# Patient Record
Sex: Female | Born: 1980 | Marital: Married | Smoking: Never smoker
Health system: Southern US, Community
[De-identification: ages and names within clinical notes are randomized; demographics above are authoritative.]

---

## 2012-10-19 ENCOUNTER — Ambulatory Visit: Payer: Self-pay | Admitting: Family Medicine

## 2013-09-13 ENCOUNTER — Emergency Department: Payer: Self-pay | Admitting: Emergency Medicine

## 2014-08-02 ENCOUNTER — Ambulatory Visit
Admission: RE | Admit: 2014-08-02 | Discharge: 2014-08-02 | Disposition: A | Discharge status: Home / Self Care | Payer: 59 | Source: Ambulatory Visit | Attending: Family Medicine | Admitting: Family Medicine

## 2014-08-02 ENCOUNTER — Other Ambulatory Visit: Payer: Self-pay | Admitting: Family Medicine

## 2014-08-02 DIAGNOSIS — R05 Cough: Secondary | ICD-10-CM | POA: Diagnosis present

## 2014-08-02 DIAGNOSIS — R059 Cough, unspecified: Secondary | ICD-10-CM

## 2014-12-22 ENCOUNTER — Ambulatory Visit
Admission: EM | Admit: 2014-12-22 | Discharge: 2014-12-22 | Disposition: A | Discharge status: Home / Self Care | Payer: 59 | Attending: Internal Medicine | Admitting: Internal Medicine

## 2014-12-22 DIAGNOSIS — T700XXA Otitic barotrauma, initial encounter: Secondary | ICD-10-CM

## 2014-12-22 DIAGNOSIS — J302 Other seasonal allergic rhinitis: Secondary | ICD-10-CM

## 2014-12-22 LAB — RAPID STREP SCREEN (MED CTR MEBANE ONLY): STREPTOCOCCUS, GROUP A SCREEN (DIRECT): NEGATIVE

## 2014-12-22 MED ORDER — HYDROCOD POLST-CPM POLST ER 10-8 MG/5ML PO SUER: 5.0000 mL | Freq: Two times a day (BID) | ORAL | Status: DC | PRN

## 2014-12-22 MED ORDER — BENZONATATE 100 MG PO CAPS: 100.0000 mg | ORAL_CAPSULE | Freq: Three times a day (TID) | ORAL | Status: DC | PRN

## 2014-12-22 MED ORDER — FLUTICASONE PROPIONATE 50 MCG/ACT NA SUSP: 2.0000 | Freq: Every day | NASAL | Status: AC

## 2014-12-22 NOTE — Discharge Instructions (Signed)
Robitussin DM  ( Guaifenesin DM )  1 teaspoon every 4 hours   Increase your liquids so that your lips are not dry and your void about every 2 hours  Claritin ( loratadine )  1 tablet a day ---if the post nasal drip goes away-continue this until about Christmas      Barotitis Media Barotitis media is inflammation of your middle ear. This occurs when the auditory tube (eustachian tube) leading from the back of your nose (nasopharynx) to your eardrum is blocked. This blockage may result from a cold, environmental allergies, or an upper respiratory infection. Unresolved barotitis media may lead to damage or hearing loss (barotrauma), which may become permanent. HOME CARE INSTRUCTIONS   Use medicines as recommended by your health care provider. Over-the-counter medicines will help unblock the canal and can help during times of air travel.  Do not put anything into your ears to clean or unplug them. Eardrops will not be helpful.  Do not swim, dive, or fly until your health care provider says it is all right to do so. If these activities are necessary, chewing gum with frequent, forceful swallowing may help. It is also helpful to hold your nose and gently blow to pop your ears for equalizing pressure changes. This forces air into the eustachian tube.  Only take over-the-counter or prescription medicines for pain, discomfort, or fever as directed by your health care provider.  A decongestant may be helpful in decongesting the middle ear and make pressure equalization easier. SEEK MEDICAL CARE IF:  You experience a serious form of dizziness in which you feel as if the room is spinning and you feel nauseated (vertigo).  Your symptoms only involve one ear. SEEK IMMEDIATE MEDICAL CARE IF:   You develop a severe headache, dizziness, or severe ear pain.  You have bloody or pus-like drainage from your ears.  You develop a fever.  Your problems do not improve or become worse. MAKE SURE YOU:    Understand these instructions.  Will watch your condition.  Will get help right away if you are not doing well or get worse. Document Released: 03/06/2000 Document Revised: 12/28/2012 Document Reviewed: 10/04/2012 [facility] Patient Information 2015 [place], [place]. This information is not intended to replace advice given to you by your health care provider. Make sure you discuss any questions you have with your health care provider.

## 2014-12-22 NOTE — ED Notes (Signed)
Patient sick for the last 6 days with cold, congestion and cough.

## 2014-12-24 ENCOUNTER — Encounter: Payer: Self-pay | Admitting: Physician Assistant

## 2014-12-24 LAB — CULTURE, GROUP A STREP (THRC)

## 2014-12-24 NOTE — ED Provider Notes (Signed)
CSN: [id number]     Arrival date & time 12/22/14  1506 History   First MD Initiated Contact with Patient 12/22/14 1704     Chief Complaint  Patient presents with  . Nasal Congestion  . Cough   (Consider location/radiation/quality/duration/timing/severity/associated sxs/prior Treatment) Patient is a 34 y.o. female presenting with cough. The history is provided by the patient.  Cough Cough characteristics:  Non-productive and dry Severity:  Mild Onset quality:  Gradual Duration:  6 days Timing:  Intermittent Progression:  Waxing and waning Chronicity:  New Smoker: no   Context: sick contacts   Worsened by:  Nothing tried Ineffective treatments:  Fluids Associated symptoms: rhinorrhea and sore throat   She has had post nasal drip and congestion with a runny nose and sore throat. No particular fever, mild malaise. Not sleeping well -coughing- Ears feel full and crackle  History reviewed. No pertinent past medical history. History reviewed. No pertinent past surgical history. History reviewed. No pertinent family history. Social History  Substance Use Topics  . Smoking status: Never Smoker   . Smokeless tobacco: Never Used  . Alcohol Use: No   OB History    No data available     Review of Systems  Constitutional: Positive for appetite change and fatigue.  HENT: Positive for congestion, rhinorrhea and sore throat.   Eyes: Negative.   Respiratory: Positive for cough.   Cardiovascular: Negative.   Gastrointestinal: Negative.   Endocrine: Negative.   Genitourinary: Negative.   Musculoskeletal: Negative.   Skin: Negative.   Allergic/Immunologic: Negative.   Neurological: Negative.   Hematological: Negative.   Psychiatric/Behavioral: Negative.     Allergies  Review of patient's allergies indicates no known allergies.  Home Medications   Prior to Admission medications   Medication Sig Start Date End Date Taking? Authorizing Provider  benzonatate (TESSALON) 100 MG  capsule Take 1 capsule (100 mg total) by mouth 3 (three) times daily as needed. 12/22/14   Rae Halsted, PA-C  chlorpheniramine-HYDROcodone [facility] PENNKINETIC ER) 10-8 MG/5ML SUER Take 5 mLs by mouth every 12 (twelve) hours as needed for cough. 12/22/14   Rae Halsted, PA-C  fluticasone [facility]) 50 MCG/ACT nasal spray Place 2 sprays into both nostrils daily. 12/22/14   Rae Halsted, PA-C   Meds Ordered and Administered this Visit  Medications - No data to display  BP 116/67 mmHg  Pulse 66  Temp(Src) 99 F (37.2 C) (Tympanic)  Ht  (1.651 m)  Wt 183 lb (83.008 kg)  BMI 30.45 kg/m2  SpO2 100%  LMP 11/25/2014 (Approximate) No data found.   Physical Exam  Constitutional: She is oriented to person, place, and time. She appears well-developed and well-nourished.  HENT:  Head: Normocephalic and atraumatic.  Right Ear: Hearing normal.  Left Ear: Hearing normal.  Nose: Rhinorrhea present. Right sinus exhibits no maxillary sinus tenderness. Left sinus exhibits no maxillary sinus tenderness.  Mouth/Throat: Posterior oropharyngeal erythema present. No oropharyngeal exudate.  Eyes: Right eye exhibits no discharge. Left eye exhibits no discharge.  Neck: Normal range of motion.  Cardiovascular: Normal rate and regular rhythm.   Pulmonary/Chest: Effort normal and breath sounds normal. No respiratory distress.  Musculoskeletal: Normal range of motion.  Lymphadenopathy:    She has no cervical adenopathy.  Neurological: She is alert and oriented to person, place, and time.  Skin: Skin is warm and dry. No rash noted.  Psychiatric: She has a normal mood and affect.  Eustachian tube dysfunction Ears -canals negative, TM mildy retracted,  cloudy  ED Course  Procedures (including critical care time)  Labs Review Labs Reviewed  RAPID STREP SCREEN (NOT AT [facility])  CULTURE, GROUP A STREP ([facility] ONLY)   Results for orders placed or performed during the hospital encounter of 12/22/14  Rapid  strep screen  Result Value Ref Range   Streptococcus, Group A Screen (Direct) NEGATIVE NEGATIVE  Culture, group A strep ([facility] only)  Result Value Ref Range   Specimen Description THROAT    Special Requests NONE    Culture NO BETA HEMOLYTIC STREPTOCOCCI ISOLATED    Report Status 12/24/2014 FINAL    Imaging Review No results found.         MDM   1. Barotitis media, initial encounter   Diagnosis and treatment discussed.  Questions fielded, expectations and recommendations reviewed.  Patient expresses understanding. Will return to [facility] with questions, concern or exacerbation.     Discharge Medication List as of 12/22/2014  6:11 PM    START taking these medications   Details  benzonatate (TESSALON) 100 MG capsule Take 1 capsule (100 mg total) by mouth 3 (three) times daily as needed., Starting 12/22/2014, Until Discontinued, Print    chlorpheniramine-HYDROcodone (TUSSIONEX PENNKINETIC ER) 10-8 MG/5ML SUER Take 5 mLs by mouth every 12 (twelve) hours as needed for cough., Starting 12/22/2014, Until Discontinued, Print    fluticasone (FLONASE) 50 MCG/ACT nasal spray Place 2 sprays into both nostrils daily., Starting 12/22/2014, Until Discontinued, Print         Rae Halsted, PA-C 12/24/14 2300

## 2015-01-02 ENCOUNTER — Ambulatory Visit
Admission: RE | Admit: 2015-01-02 | Discharge: 2015-01-02 | Disposition: A | Discharge status: Home / Self Care | Payer: 59 | Source: Ambulatory Visit | Attending: Family Medicine | Admitting: Family Medicine

## 2015-01-02 ENCOUNTER — Encounter: Payer: Self-pay | Admitting: Family Medicine

## 2015-01-02 ENCOUNTER — Ambulatory Visit (INDEPENDENT_AMBULATORY_CARE_PROVIDER_SITE_OTHER): Payer: 59 | Admitting: Family Medicine

## 2015-01-02 VITALS — BP 118/68 | HR 97 | Temp 100.4°F | Resp 19 | Ht 65.0 in | Wt 180.3 lb

## 2015-01-02 DIAGNOSIS — J189 Pneumonia, unspecified organism: Secondary | ICD-10-CM | POA: Insufficient documentation

## 2015-01-02 DIAGNOSIS — R509 Fever, unspecified: Secondary | ICD-10-CM | POA: Diagnosis present

## 2015-01-02 DIAGNOSIS — J0100 Acute maxillary sinusitis, unspecified: Secondary | ICD-10-CM

## 2015-01-02 DIAGNOSIS — R058 Other specified cough: Secondary | ICD-10-CM

## 2015-01-02 DIAGNOSIS — R05 Cough: Secondary | ICD-10-CM

## 2015-01-02 DIAGNOSIS — R0982 Postnasal drip: Secondary | ICD-10-CM | POA: Insufficient documentation

## 2015-01-02 DIAGNOSIS — L989 Disorder of the skin and subcutaneous tissue, unspecified: Secondary | ICD-10-CM | POA: Insufficient documentation

## 2015-01-02 MED ORDER — AZITHROMYCIN 250 MG PO TABS: ORAL_TABLET | ORAL | Status: DC

## 2015-01-02 MED ORDER — GUAIFENESIN-CODEINE 100-10 MG/5ML PO SOLN: 10.0000 mL | Freq: Four times a day (QID) | ORAL | Status: DC | PRN

## 2015-01-02 NOTE — Progress Notes (Signed)
Name: Waynard EdwardsSruti D Koone   MRN: [id number]    DOB: 1980-07-13   Date:01/02/2015       Progress Note  Subjective  Chief Complaint  Chief Complaint  Patient presents with  . Acute Visit    Cough x10 days   Cough This is a new problem. The current episode started 1 to 4 weeks ago (over 10 days ago.). The problem has been gradually worsening. The cough is productive of sputum. Associated symptoms include chills, ear pain, a fever, headaches and nasal congestion. Pertinent negatives include no hemoptysis, sore throat or shortness of breath. Associated symptoms comments: Pressure over the sinuses.. She has tried prescription cough suppressant for the symptoms. The treatment provided mild relief. There is no history of asthma or COPD.   History reviewed. No pertinent past medical history.  Past Surgical History  Procedure Laterality Date  . Cesarean section  2014    Family History  Problem Relation Age of Onset  . Hypertension Father   . Kidney failure Mother     Social History   Social History  . Marital Status: Married    Spouse Name: N/A  . Number of Children: N/A  . Years of Education: N/A   Occupational History  . Not on file.   Social History Main Topics  . Smoking status: Never Smoker   . Smokeless tobacco: Never Used  . Alcohol Use: No  . Drug Use: Not on file  . Sexual Activity: Not on file   Other Topics Concern  . Not on file   Social History Narrative    Current outpatient prescriptions:  .  benzonatate (TESSALON) 100 MG capsule, Take 1 capsule (100 mg total) by mouth 3 (three) times daily as needed., Disp: 30 capsule, Rfl: 0 .  fluticasone (FLONASE) 50 MCG/ACT nasal spray, Place 2 sprays into both nostrils daily., Disp: 16 g, Rfl: 2 .  chlorpheniramine-HYDROcodone (TUSSIONEX PENNKINETIC ER) 10-8 MG/5ML SUER, Take 5 mLs by mouth every 12 (twelve) hours as needed for cough. (Patient not taking: Reported on 01/02/2015), Disp: 60 mL, Rfl: 0  No Known  Allergies  Review of Systems  Constitutional: Positive for fever and chills.  HENT: Positive for ear pain. Negative for sore throat.   Respiratory: Positive for cough. Negative for hemoptysis and shortness of breath.   Neurological: Positive for headaches.   Objective  Filed Vitals:   01/02/15 0944  BP: 118/68  Pulse: 97  Temp: 100.4 F (38 C)  TempSrc: Oral  Resp: 19  Height: 5\' 5"  (1.651 m)  Weight: 180 lb 4.8 oz (81.784 kg)  SpO2: 96%   Physical Exam  Constitutional: She is well-developed, well-nourished, and in no distress.  HENT:  Head: Normocephalic and atraumatic.  Right Ear: Tympanic membrane normal.  Left Ear: Tympanic membrane and ear canal normal.  Nose: Right sinus exhibits maxillary sinus tenderness. Left sinus exhibits maxillary sinus tenderness.  Mouth/Throat: Posterior oropharyngeal erythema present.  Mild erythema in the upper right ear canal.  Cardiovascular: Regular rhythm.   Pulmonary/Chest: Breath sounds normal.  Soft expiratory wheezes in right and left lung fields.  Nursing note and vitals reviewed.  Assessment & Plan  1. Acute non-recurrent maxillary sinusitis Symptoms consistent with acute maxillary sinusitis. Started on antibiotic therapy. - azithromycin (ZITHROMAX Z-PAK) 250 MG tablet; 2 tabs po x day 1, then 1 tab po q day x 4 days  Dispense: 6 each; Refill: 0  2. Productive cough Obtain chest x-ray to rule out pneumonia given the duration of  symptoms and abnormal lung exam. Started on codeine for relief of coughing. - DG Chest 2 View; Future - guaiFENesin-codeine 100-10 MG/5ML syrup; Take 10 mLs by mouth every 6 (six) hours as needed for cough.  Dispense: 200 mL; Refill: 0   Bohdan Macho Asad A. [facility] 01/02/2015 9:52 AM

## 2015-01-03 ENCOUNTER — Telehealth: Payer: Self-pay | Admitting: Family Medicine

## 2015-01-03 NOTE — Telephone Encounter (Signed)
Patient saw her xray in MyChart and it said she had pneumonia. She would like for you to check the results and would like something called in [facility] [address]

## 2015-01-03 NOTE — Telephone Encounter (Signed)
Results have been reported to patient 

## 2015-01-08 ENCOUNTER — Telehealth: Payer: Self-pay | Admitting: Family Medicine

## 2015-01-08 DIAGNOSIS — R05 Cough: Secondary | ICD-10-CM

## 2015-01-08 DIAGNOSIS — J189 Pneumonia, unspecified organism: Secondary | ICD-10-CM

## 2015-01-08 DIAGNOSIS — R058 Other specified cough: Secondary | ICD-10-CM

## 2015-01-08 MED ORDER — GUAIFENESIN-CODEINE 100-10 MG/5ML PO SOLN: 10.0000 mL | Freq: Four times a day (QID) | ORAL | Status: DC | PRN

## 2015-01-08 NOTE — Telephone Encounter (Signed)
PT SAID THAT SHE IS STILL HAVING  THE COUGH AND HAS TAKEN UP THE MEDICATION THAT YOU GAVE HER. SHE WANTS TO KNOW IF YOU COULD GIVE HER MORE COUGH MEDICINE AND SHE IS ALSO WANTING TO KNOW IF YOU  WANT HER TO GET MORE ANTIBIOTICS. SHE IS HAVING STILL WHEEZING AND [place]AV[place] ON [place] 9 AND BE GONE FOR 3 MONTHS. ALSO WANTS TO KNOW IF SHE CAN HAVE HER XRAY AROUND NOV 3RD OR 4TH AND HAVE AN APPT TO GET RESULTS BEFORE SHE LEAVES ON NOV 9TH.

## 2015-01-08 NOTE — Telephone Encounter (Signed)
Patient has wheezing, some shortness of breath on exertion but her coughing has significantly decreased. No fevers or chills. She has finished a course of antibiotics. We have recommended that she use albuterol inhaler up to 4 times a day for wheezing and will prescribe another week off Cheratussin. Patient will return in 10 days to repeat chest x-ray.

## 2015-01-08 NOTE — Telephone Encounter (Signed)
Ordered chest x-ray for 01/17/2015

## 2015-01-08 NOTE — Telephone Encounter (Signed)
NEEDS XRAY ORDERS FOR CHEST XRAY

## 2015-01-09 ENCOUNTER — Telehealth: Payer: Self-pay | Admitting: Family Medicine

## 2015-01-09 ENCOUNTER — Other Ambulatory Visit: Payer: Self-pay | Admitting: Family Medicine

## 2015-01-09 DIAGNOSIS — R05 Cough: Secondary | ICD-10-CM

## 2015-01-09 DIAGNOSIS — R058 Other specified cough: Secondary | ICD-10-CM

## 2015-01-09 MED ORDER — GUAIFENESIN-CODEINE 100-10 MG/5ML PO SOLN: 10.0000 mL | Freq: Four times a day (QID) | ORAL | Status: DC | PRN

## 2015-01-09 NOTE — Telephone Encounter (Signed)
Spoke with patient and notified her that a message was left asking her to pick up prescription at office and bring photo ID, she will come in and pick up prescription in the morning 01/10/2015

## 2015-01-09 NOTE — Telephone Encounter (Signed)
Pt made aware and said that the dr also called and spoke with her.

## 2015-01-09 NOTE — Telephone Encounter (Signed)
Pt made aware of orders.

## 2015-01-09 NOTE — Telephone Encounter (Signed)
Patient spoke with Dr Sherryll BurgerShah on yesterday and was told that he was going to call in a cough syrup for her. She went to the pharmacy to pick it up and it was not there. Please advise

## 2015-01-17 ENCOUNTER — Ambulatory Visit (INDEPENDENT_AMBULATORY_CARE_PROVIDER_SITE_OTHER): Payer: 59

## 2015-01-17 ENCOUNTER — Ambulatory Visit
Admission: RE | Admit: 2015-01-17 | Discharge: 2015-01-17 | Disposition: A | Discharge status: Home / Self Care | Payer: 59 | Source: Ambulatory Visit | Attending: Family Medicine | Admitting: Family Medicine

## 2015-01-17 DIAGNOSIS — Z23 Encounter for immunization: Secondary | ICD-10-CM

## 2015-01-17 DIAGNOSIS — J189 Pneumonia, unspecified organism: Secondary | ICD-10-CM | POA: Insufficient documentation

## 2015-01-28 ENCOUNTER — Ambulatory Visit: Payer: 59 | Admitting: Family Medicine

## 2015-07-17 ENCOUNTER — Encounter: Payer: Self-pay | Admitting: Family Medicine

## 2015-07-17 ENCOUNTER — Ambulatory Visit (INDEPENDENT_AMBULATORY_CARE_PROVIDER_SITE_OTHER): Payer: 59 | Admitting: Family Medicine

## 2015-07-17 VITALS — BP 100/66 | HR 81 | Temp 99.3°F | Resp 18 | Ht 64.0 in | Wt 189.4 lb

## 2015-07-17 DIAGNOSIS — L700 Acne vulgaris: Secondary | ICD-10-CM | POA: Diagnosis not present

## 2015-07-17 MED ORDER — CLINDAMYCIN PHOS-BENZOYL PEROX 1-5 % EX GEL: Freq: Two times a day (BID) | CUTANEOUS | Status: DC

## 2015-07-17 NOTE — Progress Notes (Signed)
Name: Katherine EdwardsSruti D Mcintyre   MRN: [id number]    DOB: [birth year]   Date:07/17/2015       Progress Note  Subjective  Chief Complaint  Chief Complaint  Patient presents with  . Acne    Rash This is a recurrent problem. The current episode started more than 1 month ago (2-3 months ago.). The affected locations include the face. The rash is characterized by dryness and redness (starts off as a red bump and then dries off, no drainage.). She was exposed to nothing. Pertinent negatives include no fever. Past treatments include nothing.  Patient has history of acne, facial lesions coincide with her menstrual cycle. Has not changed any soap, shampoo, clothing, bedding, or other items that may come into contact with her skin  History reviewed. No pertinent past medical history.  Past Surgical History  Procedure Laterality Date  . Cesarean section  2014    Family History  Problem Relation Age of Onset  . Hypertension Father   . Kidney failure Mother     Social History   Social History  . Marital Status: Married    Spouse Name: N/A  . Number of Children: N/A  . Years of Education: N/A   Occupational History  . Not on file.   Social History Main Topics  . Smoking status: Never Smoker   . Smokeless tobacco: Never Used  . Alcohol Use: No  . Drug Use: Not on file  . Sexual Activity: Not on file   Other Topics Concern  . Not on file   Social History Narrative     Current outpatient prescriptions:  .  fluticasone (FLONASE) 50 MCG/ACT nasal spray, Place 2 sprays into both nostrils daily., Disp: 16 g, Rfl: 2 .  azithromycin (ZITHROMAX Z-PAK) 250 MG tablet, 2 tabs po x day 1, then 1 tab po q day x 4 days (Patient not taking: Reported on 07/17/2015), Disp: 6 each, Rfl: 0 .  guaiFENesin-codeine 100-10 MG/5ML syrup, Take 10 mLs by mouth every 6 (six) hours as needed for cough. (Patient not taking: Reported on 07/17/2015), Disp: 300 mL, Rfl: 0  No Known Allergies   Review of Systems   Constitutional: Negative for fever, chills and weight loss.  Cardiovascular: Negative for chest pain.  Gastrointestinal: Negative for abdominal pain.  Skin: Positive for rash. Negative for itching.     Objective  Filed Vitals:   07/17/15 1123  BP: 100/66  Pulse: 81  Temp: 99.3 F (37.4 C)  TempSrc: Oral  Resp: 18  Height: 5\' 4"  (1.626 m)  Weight: 189 lb 6.4 oz (85.911 kg)  SpO2: 98%    Physical Exam  Constitutional: She is oriented to person, place, and time and well-developed, well-nourished, and in no distress.  Neurological: She is alert and oriented to person, place, and time.  Skin: Skin is warm. Rash noted. Rash is pustular.  Pustular lesions on the face, some with surrounding macular rash, no drainage visible, lesions are pruritic in nature.   Nursing note and vitals reviewed.      Assessment & Plan  1. Superficial inflammatory acne vulgaris Exam and history consistent with acne, will start on BenzaClin gel to be applied twice daily. Recheck in one month. - clindamycin-benzoyl peroxide (BENZACLIN) gel; Apply topically 2 (two) times daily.  Dispense: 50 g; Refill: 0   Katherine Mcintyre Katherine A. Faylene KurtzShah [facility] 07/17/2015 11:34 AM

## 2015-08-23 ENCOUNTER — Other Ambulatory Visit: Payer: Self-pay | Admitting: Family Medicine

## 2015-08-23 ENCOUNTER — Ambulatory Visit (INDEPENDENT_AMBULATORY_CARE_PROVIDER_SITE_OTHER): Payer: 59 | Admitting: Family Medicine

## 2015-08-23 ENCOUNTER — Encounter: Payer: Self-pay | Admitting: Family Medicine

## 2015-08-23 VITALS — BP 103/68 | HR 75 | Temp 98.9°F | Resp 15 | Ht 64.0 in | Wt 191.6 lb

## 2015-08-23 DIAGNOSIS — Z01419 Encounter for gynecological examination (general) (routine) without abnormal findings: Secondary | ICD-10-CM

## 2015-08-23 NOTE — Progress Notes (Signed)
Name: Katherine Mcintyre   MRN: [id number]    DOB: 31-May-1980   Date:08/23/2015       Progress Note  Subjective  Chief Complaint  Chief Complaint  Patient presents with  . Annual Exam    CPE w/pap    HPI  Pt. Is here for a Complete Physical Exam,  No Gynecologist, last seen for pregnancy in April 2014 and did not follow up after delivery.   History reviewed. No pertinent past medical history.  Past Surgical History  Procedure Laterality Date  . Cesarean section  2014    Family History  Problem Relation Age of Onset  . Hypertension Father   . Kidney failure Mother     Social History   Social History  . Marital Status: Married    Spouse Name: N/A  . Number of Children: N/A  . Years of Education: N/A   Occupational History  . Not on file.   Social History Main Topics  . Smoking status: Never Smoker   . Smokeless tobacco: Never Used  . Alcohol Use: No  . Drug Use: Not on file  . Sexual Activity: Not on file   Other Topics Concern  . Not on file   Social History Narrative     Current outpatient prescriptions:  .  fluticasone (FLONASE) 50 MCG/ACT nasal spray, Place 2 sprays into both nostrils daily., Disp: 16 g, Rfl: 2  No Known Allergies   Review of Systems  Constitutional: Negative for fever and chills.  HENT: Positive for congestion.   Eyes: Negative for blurred vision.  Respiratory: Negative for cough and shortness of breath.   Cardiovascular: Negative for chest pain.  Gastrointestinal: Negative for nausea, vomiting, abdominal pain, diarrhea and constipation.  Genitourinary: Negative for dysuria and frequency.  Musculoskeletal: Positive for back pain (chronic low back pain.). Negative for myalgias.  Skin: Negative for rash.  Neurological: Negative for headaches.  Psychiatric/Behavioral: Negative for depression. The patient is not nervous/anxious and does not have insomnia.     Objective  Filed Vitals:   08/23/15 0909  BP: 103/68  Pulse: 75    Temp: 98.9 F (37.2 C)  TempSrc: Oral  Resp: 15  Height:  (1.626 m)  Weight: 191 lb 9.6 oz (86.909 kg)  SpO2: 97%    Physical Exam  Constitutional: She is oriented to person, place, and time and well-developed, well-nourished, and in no distress.  HENT:  Head: Normocephalic and atraumatic.  Right Ear: Tympanic membrane and ear canal normal.  Left Ear: Tympanic membrane and ear canal normal.  Nose: Right sinus exhibits no maxillary sinus tenderness. Left sinus exhibits no maxillary sinus tenderness.  Mouth/Throat: No posterior oropharyngeal erythema.  Right nasal turbinate hypertrophy.  Eyes: Pupils are equal, round, and reactive to light.  Cardiovascular: S1 normal and S2 normal.   Murmur heard.  Systolic murmur is present with a grade of 2/6  Grade 2/6 systolic murmur   Pulmonary/Chest: Breath sounds normal.  Abdominal: Soft. Bowel sounds are normal. There is no tenderness. There is no CVA tenderness.  Genitourinary: Vagina normal, uterus normal, cervix normal, right adnexa normal and left adnexa normal. Cervix exhibits no motion tenderness.  Musculoskeletal:       Right ankle: She exhibits no swelling.       Left ankle: She exhibits no swelling.       Lumbar back: She exhibits tenderness and spasm.       Back:  Neurological: She is alert and oriented to person, place, and  time.  Skin: Skin is warm and dry.  Psychiatric: Mood, memory, affect and judgment normal.  Nursing note and vitals reviewed.     Assessment & Plan  1. Well woman exam with routine gynecological exam Obtain Pap smear and other age appropriate screenings. - Cytology - PAP - Lipid Profile - Comprehensive Metabolic Panel (CMET) - TSH - Vitamin D (25 hydroxy)   Naba Sneed Asad A. Faylene KurtzShah [facility] 08/23/2015 9:36 AM

## 2015-08-24 LAB — COMPREHENSIVE METABOLIC PANEL
A/G RATIO: 1.5 (ref 1.2–2.2)
ALBUMIN: 4 g/dL (ref 3.5–5.5)
ALK PHOS: 49 IU/L (ref 39–117)
ALT: 13 IU/L (ref 0–32)
AST: 15 IU/L (ref 0–40)
BILIRUBIN TOTAL: 0.3 mg/dL (ref 0.0–1.2)
BUN / CREAT RATIO: 17 (ref 9–23)
BUN: 8 mg/dL (ref 6–20)
CALCIUM: 9 mg/dL (ref 8.7–10.2)
CHLORIDE: 101 mmol/L (ref 96–106)
CO2: 25 mmol/L (ref 18–29)
Creatinine, Ser: 0.47 mg/dL — ABNORMAL LOW (ref 0.57–1.00)
GFR calc Af Amer: 149 mL/min/{1.73_m2} (ref >59)
GFR calc non Af Amer: 129 mL/min/{1.73_m2} (ref >59)
GLOBULIN, TOTAL: 2.6 g/dL (ref 1.5–4.5)
Glucose: 96 mg/dL (ref 65–99)
POTASSIUM: 4.4 mmol/L (ref 3.5–5.2)
SODIUM: 139 mmol/L (ref 134–144)
Total Protein: 6.6 g/dL (ref 6.0–8.5)

## 2015-08-24 LAB — LIPID PANEL
CHOLESTEROL TOTAL: 170 mg/dL (ref 100–199)
Chol/HDL Ratio: 3.9 ratio units (ref 0.0–4.4)
HDL: 44 mg/dL (ref >39)
LDL CALC: 111 mg/dL — AB (ref 0–99)
TRIGLYCERIDES: 73 mg/dL (ref 0–149)
VLDL Cholesterol Cal: 15 mg/dL (ref 5–40)

## 2015-08-24 LAB — TSH: TSH: 2.3 u[IU]/mL (ref 0.450–4.500)

## 2015-08-24 LAB — VITAMIN D 25 HYDROXY (VIT D DEFICIENCY, FRACTURES): Vit D, 25-Hydroxy: 20.6 ng/mL — ABNORMAL LOW (ref 30.0–100.0)

## 2015-08-28 ENCOUNTER — Other Ambulatory Visit: Payer: Self-pay | Admitting: Family Medicine

## 2015-08-28 ENCOUNTER — Encounter: Payer: Self-pay | Admitting: Family Medicine

## 2015-08-28 ENCOUNTER — Ambulatory Visit (INDEPENDENT_AMBULATORY_CARE_PROVIDER_SITE_OTHER): Payer: 59 | Admitting: Family Medicine

## 2015-08-28 VITALS — BP 103/70 | HR 86 | Temp 98.9°F | Resp 15 | Ht 64.0 in | Wt 189.8 lb

## 2015-08-28 DIAGNOSIS — M79641 Pain in right hand: Secondary | ICD-10-CM | POA: Insufficient documentation

## 2015-08-28 DIAGNOSIS — Z124 Encounter for screening for malignant neoplasm of cervix: Secondary | ICD-10-CM | POA: Diagnosis not present

## 2015-08-28 DIAGNOSIS — E559 Vitamin D deficiency, unspecified: Secondary | ICD-10-CM | POA: Diagnosis not present

## 2015-08-28 MED ORDER — VITAMIN D (ERGOCALCIFEROL) 1.25 MG (50000 UNIT) PO CAPS: 50000.0000 [IU] | ORAL_CAPSULE | ORAL | Status: DC

## 2015-08-28 MED ORDER — CELECOXIB 100 MG PO CAPS: 100.0000 mg | ORAL_CAPSULE | Freq: Two times a day (BID) | ORAL | Status: AC

## 2015-08-28 NOTE — Progress Notes (Signed)
Name: Katherine EdwardsSruti D Quadros   MRN: [id number]    DOB: 02-10-1981   Date:08/28/2015       Progress Note  Subjective  Chief Complaint  Chief Complaint  Patient presents with  . Follow-up    repeat pap    Hand Pain  The incident occurred more than 1 week ago. There was no injury mechanism. The pain is present in the right hand (Pain in right hand palmar surface superior to the wrist.). The quality of the pain is described as shooting. The pain radiates to the right arm. She has tried immobilization for the symptoms.   Patient is here to repeat Pap Smear. She was called back for a repeat pap smear because the collection tube was expired and [facility] returned the specimen back.    History reviewed. No pertinent past medical history.  Past Surgical History  Procedure Laterality Date  . Cesarean section  2014    Family History  Problem Relation Age of Onset  . Hypertension Father   . Kidney failure Mother     Social History   Social History  . Marital Status: Married    Spouse Name: N/A  . Number of Children: N/A  . Years of Education: N/A   Occupational History  . Not on file.   Social History Main Topics  . Smoking status: Never Smoker   . Smokeless tobacco: Never Used  . Alcohol Use: No  . Drug Use: Not on file  . Sexual Activity: Not on file   Other Topics Concern  . Not on file   Social History Narrative     Current outpatient prescriptions:  .  clindamycin-benzoyl peroxide (BENZACLIN) gel, , Disp: , Rfl:  .  fluticasone (FLONASE) 50 MCG/ACT nasal spray, Place 2 sprays into both nostrils daily., Disp: 16 g, Rfl: 2  No Known Allergies   Review of Systems  Musculoskeletal: Positive for myalgias and joint pain.    Objective  Filed Vitals:   08/28/15 1003  BP: 103/70  Pulse: 86  Temp: 98.9 F (37.2 C)  TempSrc: Oral  Resp: 15  Height: 5\' 4"  (1.626 m)  Weight: 189 lb 12.8 oz (86.093 kg)  SpO2: 98%    Physical Exam  Constitutional: She is  well-developed, well-nourished, and in no distress.  Genitourinary: Cervix normal. Cervix exhibits no lesion and no tenderness.  Musculoskeletal:       Right hand: She exhibits tenderness. Normal sensation noted.       Hands: Tenderness to palpation along the right hand thenar muscles,   Nursing note and vitals reviewed.    Assessment & Plan  1. Encounter for repeat Papanicolaou smear of cervix Repeated Pap smear, will follow-up with results. - Cytology - PAP  2. 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 Advised to continue with immobilization, start on NSAID therapy. Follow-up in 2 weeks if no clinical improvement  - celecoxib (CELEBREX) 100 MG capsule; Take 1 capsule (100 mg total) by mouth 2 (two) times daily.  Dispense: 20 capsule; Refill: 0  3. Vitamin D deficiency Started on vitamin D 50,000 units for 12 weeks. - Vitamin D, Ergocalciferol, (DRISDOL) 50000 units CAPS capsule; Take 1 capsule (50,000 Units total) by mouth every 7 (seven) days.  Dispense: 12 capsule; Refill: 0   Hersh Minney Asad A. Faylene KurtzShah [facility] 08/28/2015 10:16 AM

## 2015-09-02 LAB — PAP LB (LIQUID-BASED): PAP Smear Comment: 0

## 2015-09-06 ENCOUNTER — Telehealth: Payer: Self-pay

## 2015-09-06 MED ORDER — VITAMIN D (ERGOCALCIFEROL) 1.25 MG (50000 UNIT) PO CAPS: 50000.0000 [IU] | ORAL_CAPSULE | ORAL | Status: AC

## 2015-09-06 NOTE — Telephone Encounter (Signed)
Lab results have been reported to patient and a prescription for Vitamin D 50,000 units take 1 capsule once a week for 12 weeks [facility] Dr. Sherryll BurgerShah, patient has been notified and verbalized understanding

## 2015-09-10 LAB — GYN REPORT: PAP Smear Comment: 0

## 2015-09-10 LAB — SPECIMEN STATUS REPORT

## 2015-11-22 ENCOUNTER — Other Ambulatory Visit: Payer: Self-pay | Admitting: Family Medicine

## 2016-01-23 IMAGING — CR DG CHEST 2V
1 series · 2 of 2 positions shown · non-contrast
Comparison: None.

CLINICAL DATA: Productive cough.

EXAM:
CHEST  2 VIEW

[Series 1: dg chest 2 view · 0.14mm/px · 2 of 2 slices shown]
[im 1/2]
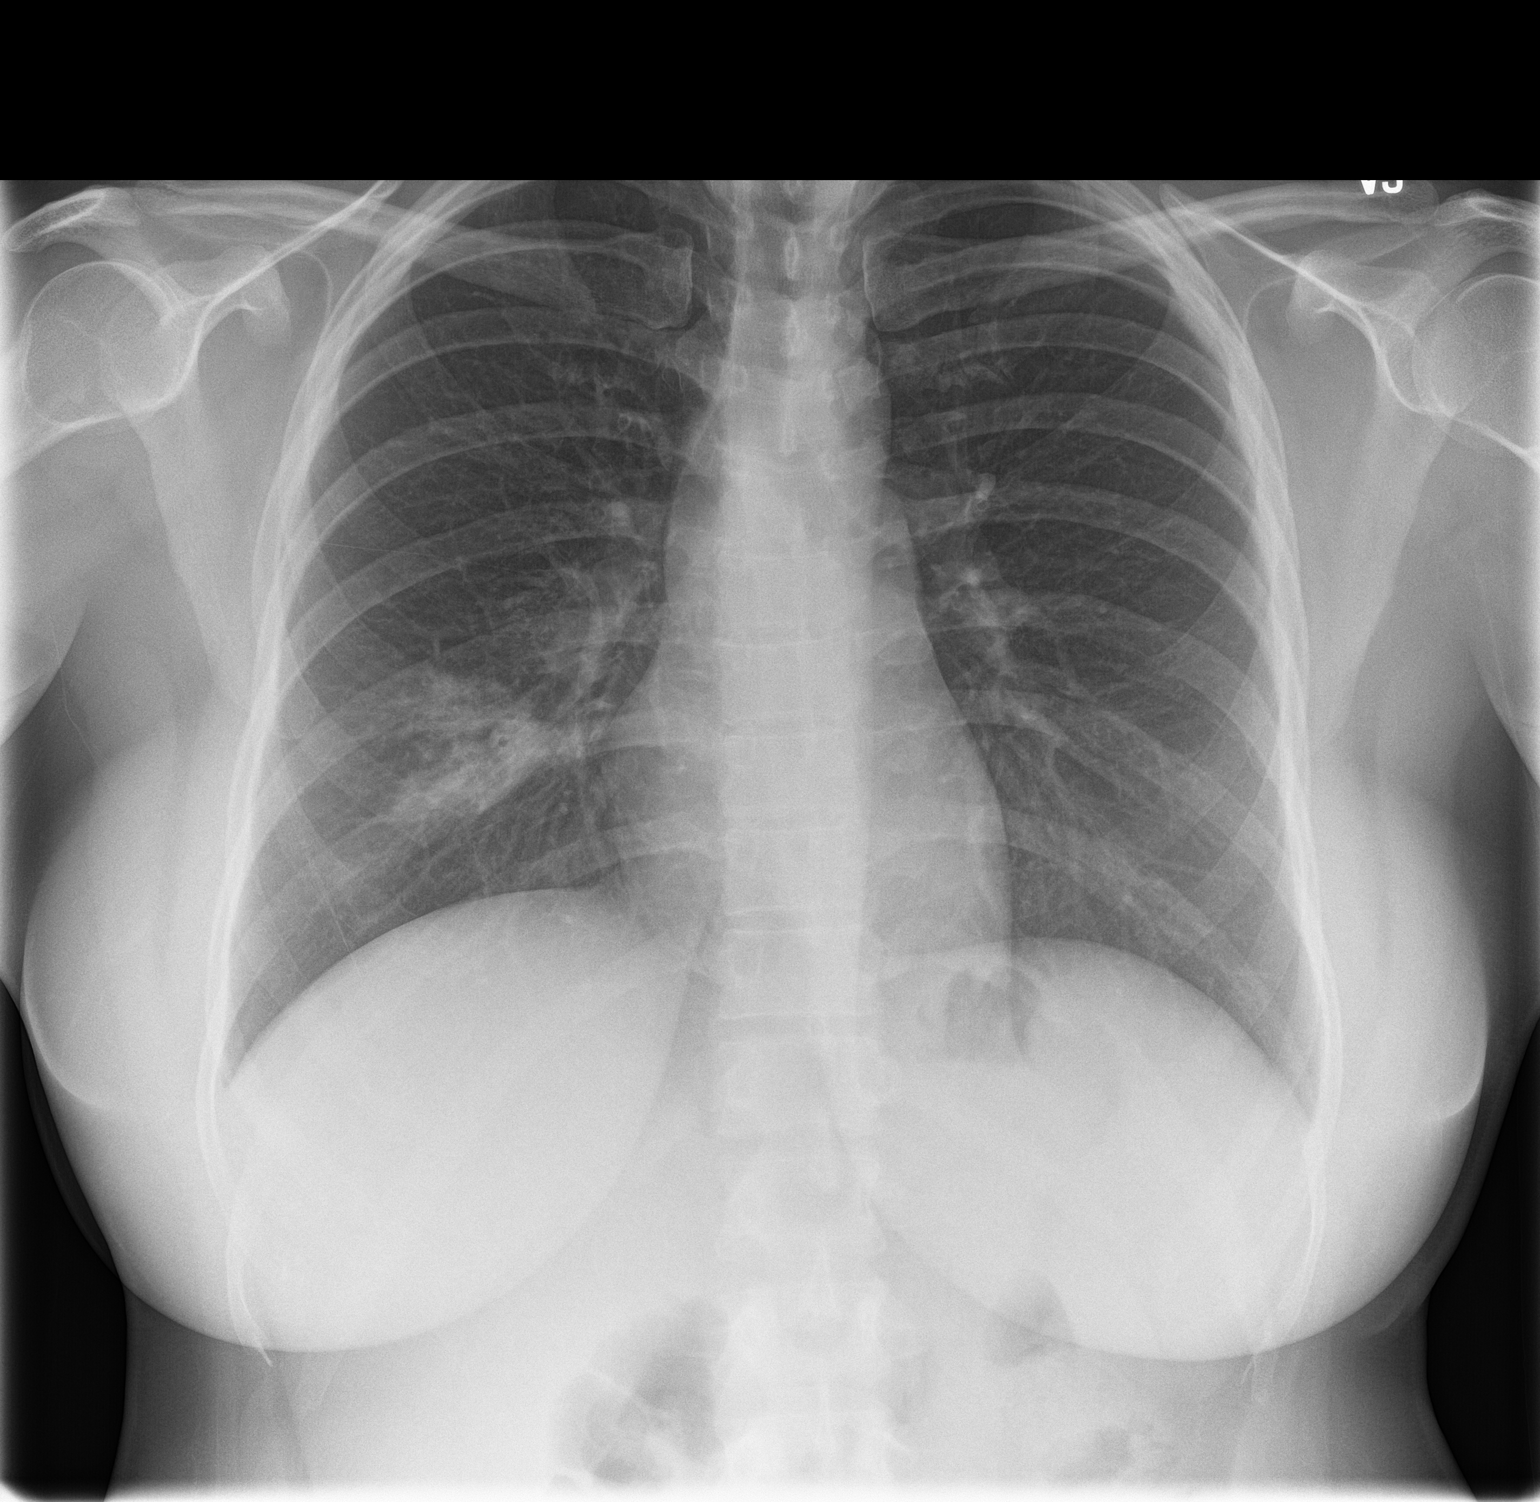
[im 2/2]
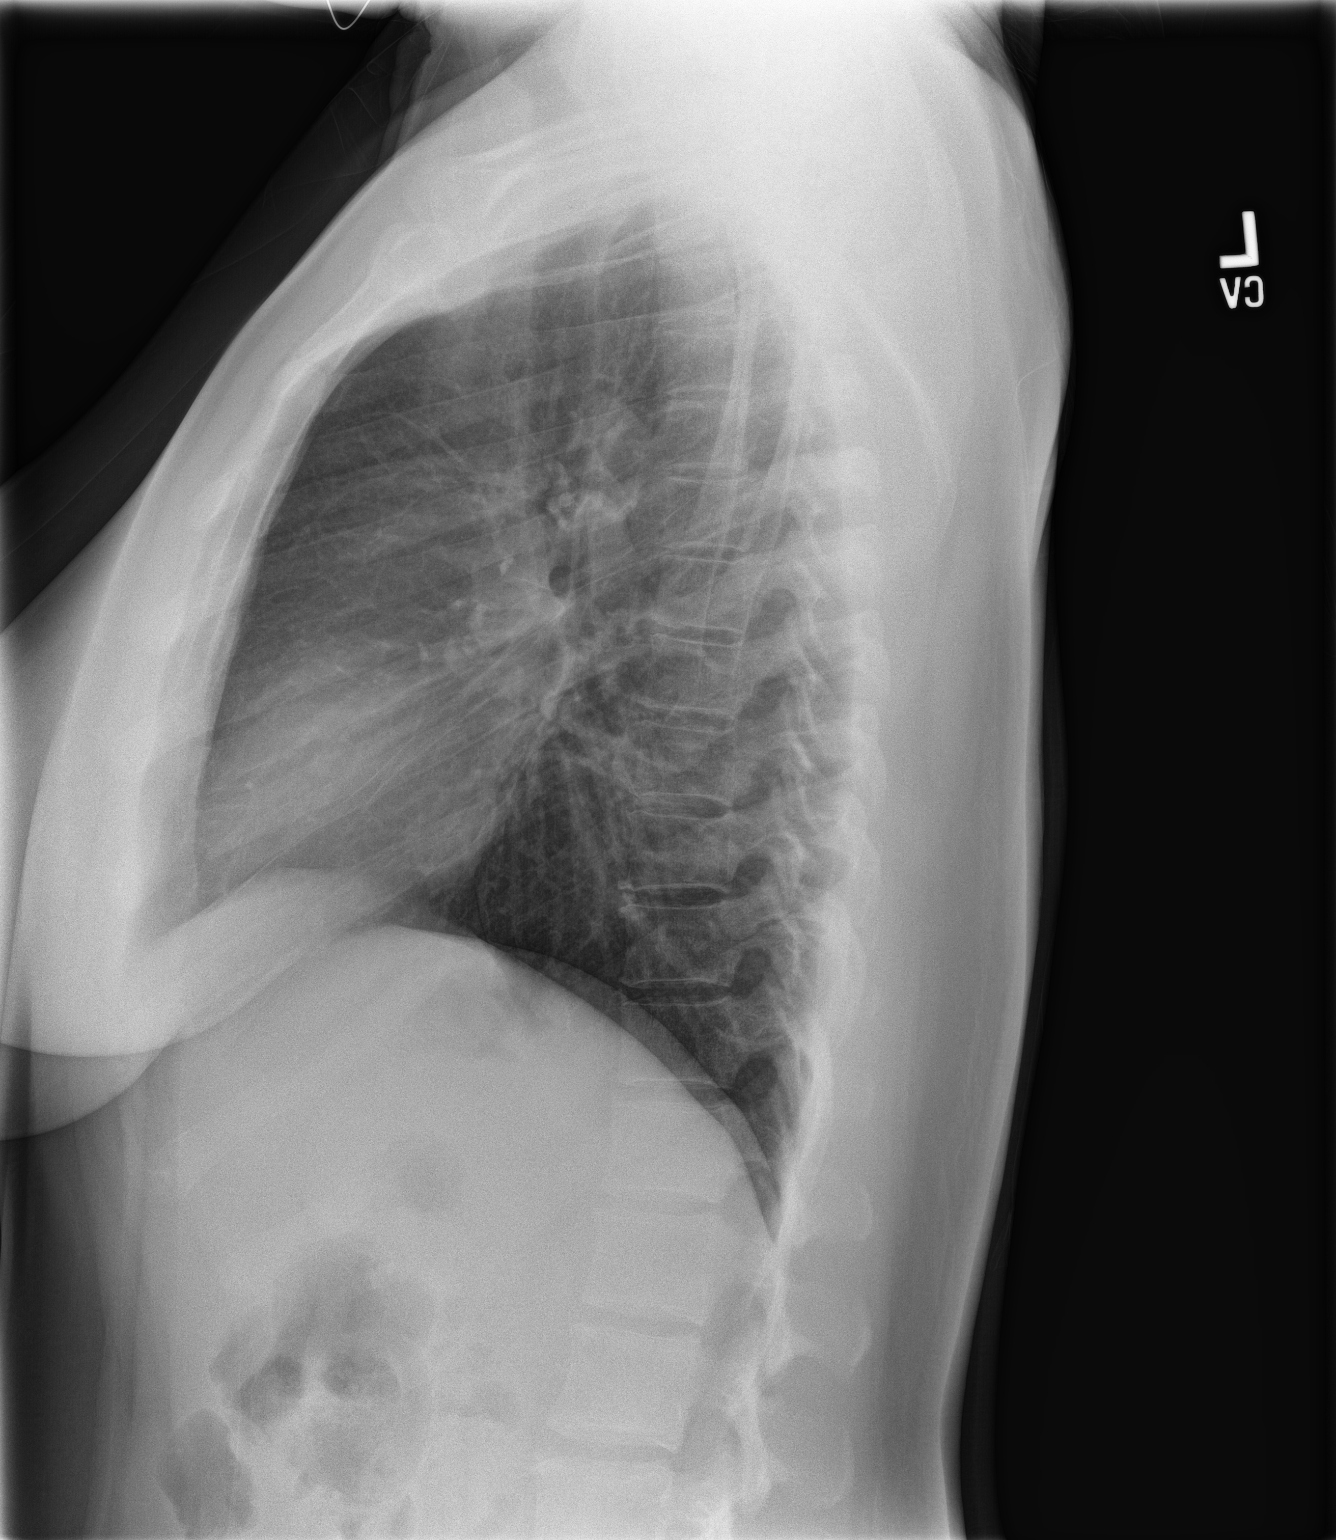

[2 of 2 positions shown; findings below may reference images not displayed]

FINDINGS: Mediastinum hilar structures normal. Right middle lobe infiltrate
consistent with pneumonia. No pleural effusion or pneumothorax.
Heart size normal. No acute bony abnormality .
IMPRESSION: Right middle lobe infiltrate consistent with pneumonia . Followup PA
and lateral chest X-ray is recommended in 3-4 weeks following trial
of antibiotic therapy to ensure resolution and exclude underlying
malignancy.

## 2016-02-07 IMAGING — CR DG CHEST 2V
1 series · 2 of 2 positions shown · non-contrast
Comparison: 01/02/2015

CLINICAL DATA: Community acquired pneumonia.  Follow-up

EXAM:
CHEST  2 VIEW

[Series 1: dg chest 2 view · 0.14mm/px · 2 of 2 slices shown]
[im 1/2]
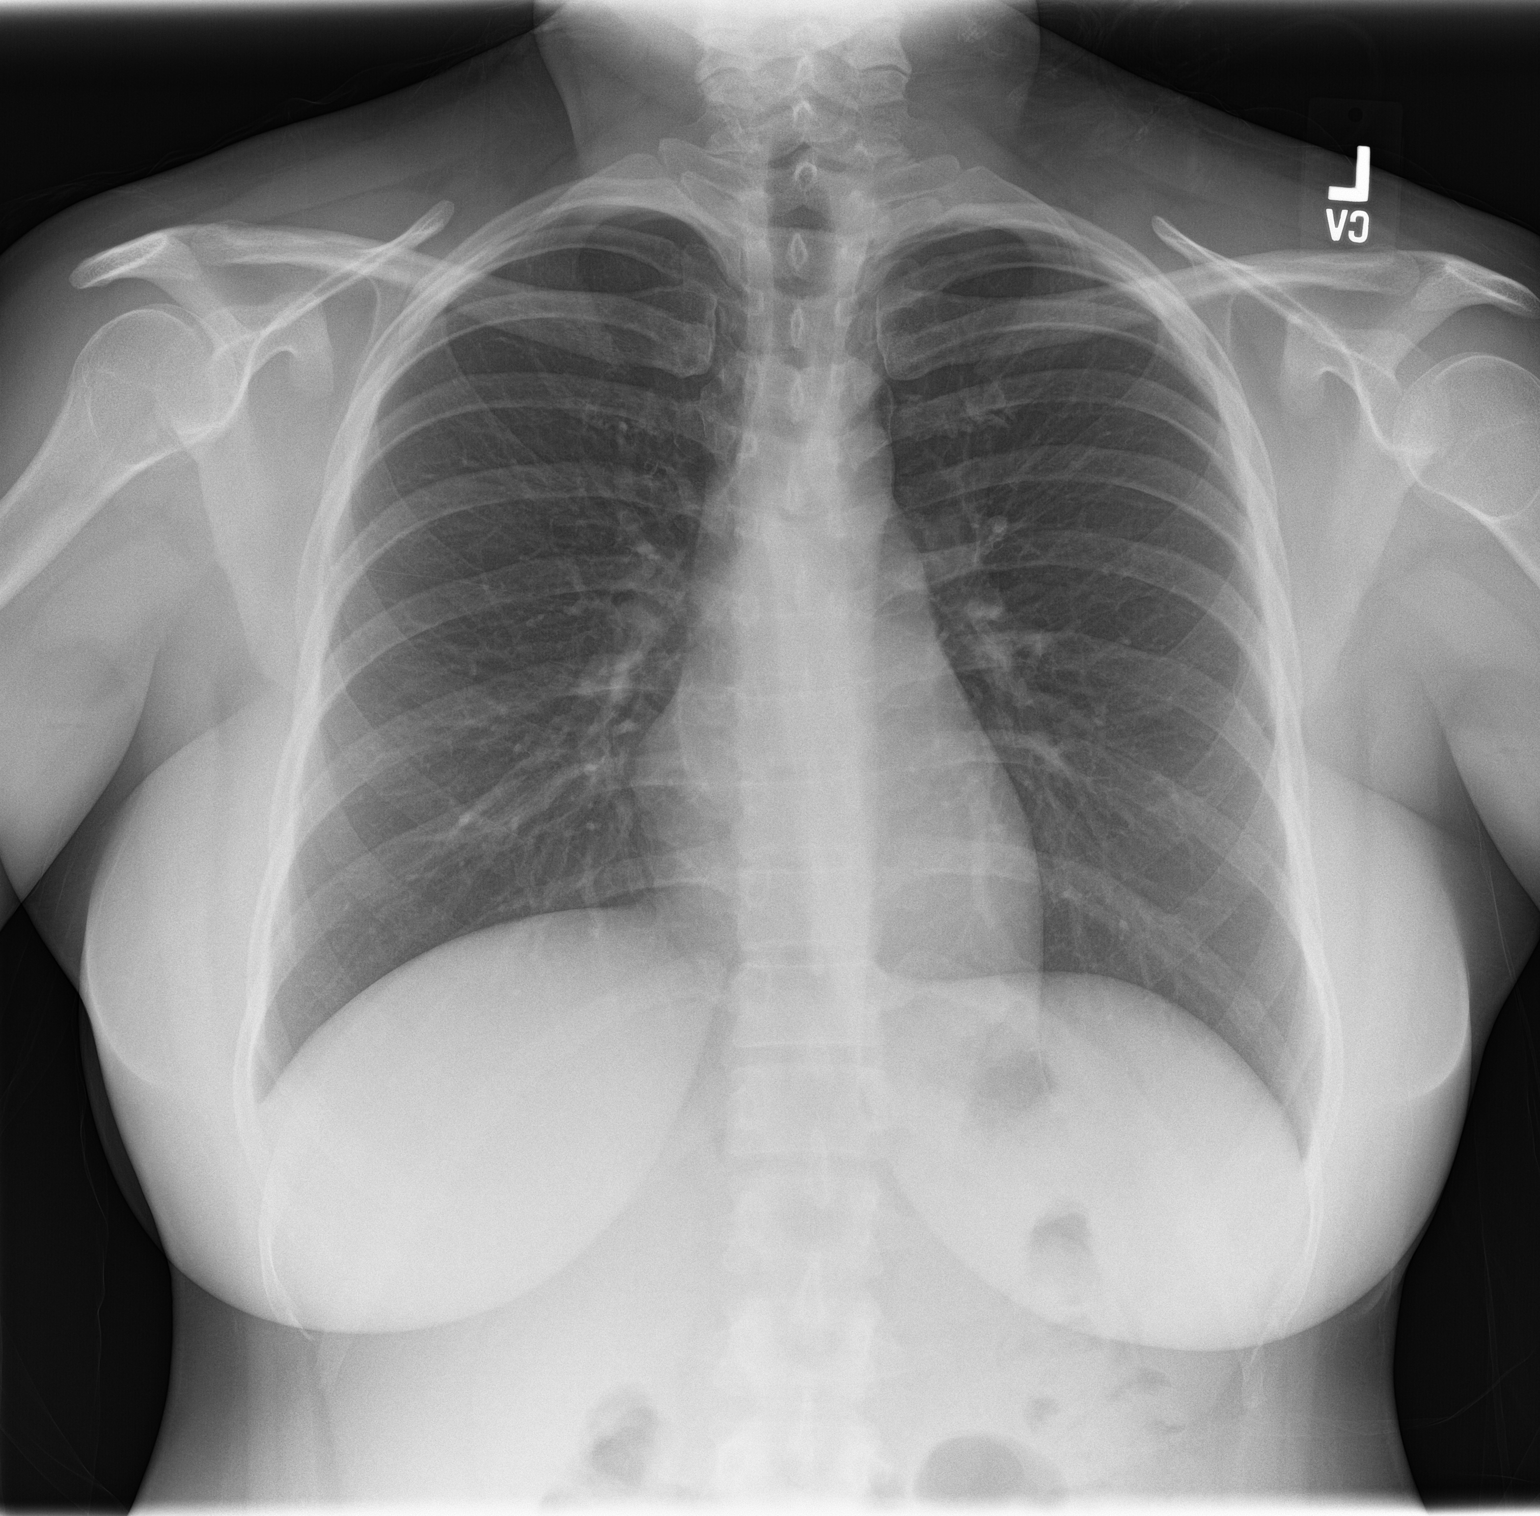
[im 2/2]
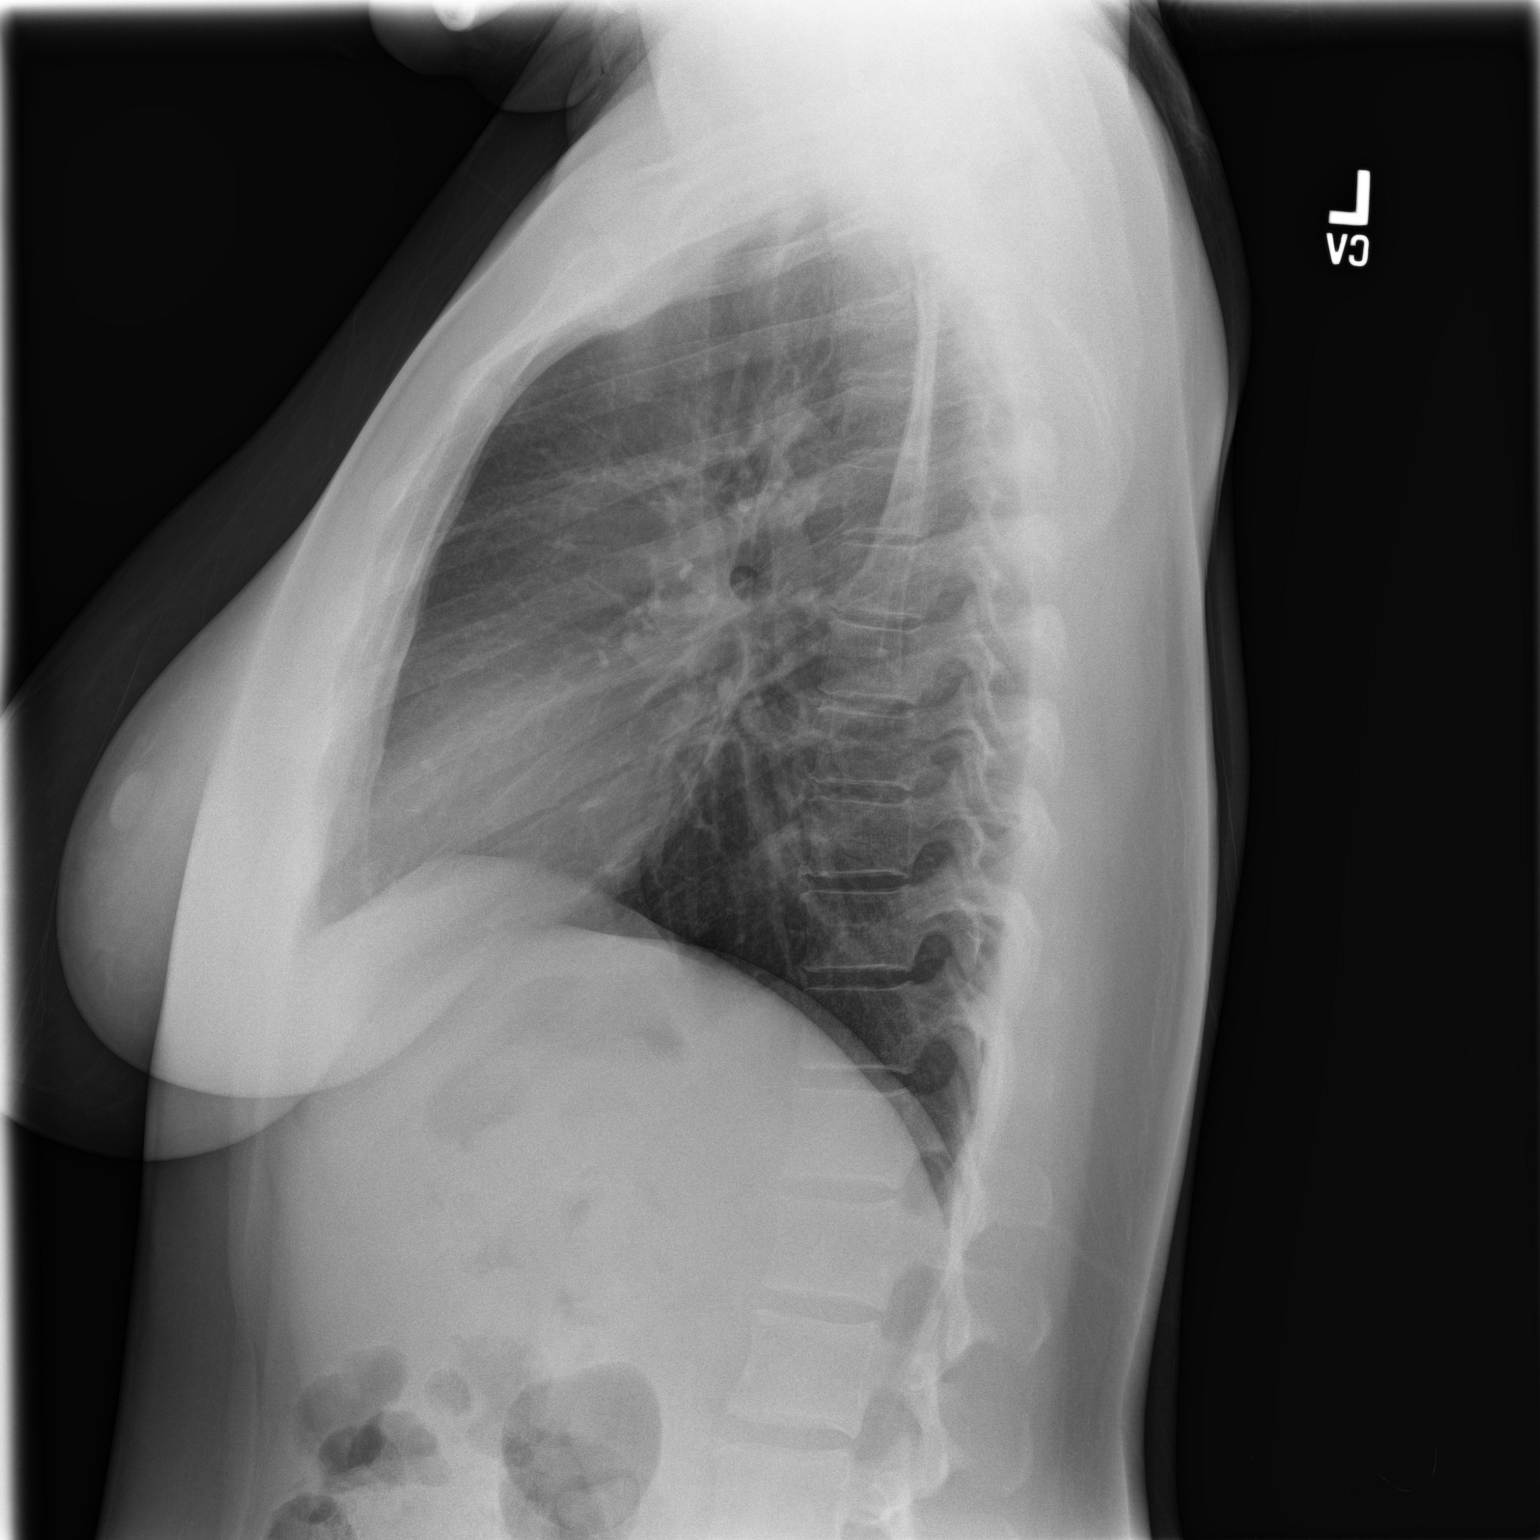

[2 of 2 positions shown; findings below may reference images not displayed]

FINDINGS: Normal heart size and mediastinal contours. No acute infiltrate or
edema. No effusion or pneumothorax. No acute osseous findings.
IMPRESSION: Normalized appearance of the chest with resolved right pneumonia.
